# Patient Record
Sex: Male | Born: 1975 | Race: Black or African American | Hispanic: No | Smoking: Former smoker
Health system: Southern US, Community
[De-identification: ages and names within clinical notes are randomized; demographics above are authoritative.]

## PROBLEM LIST (undated history)

## (undated) DIAGNOSIS — M722 Plantar fascial fibromatosis: Secondary | ICD-10-CM

## (undated) DIAGNOSIS — E785 Hyperlipidemia, unspecified: Secondary | ICD-10-CM

## (undated) DIAGNOSIS — Z8619 Personal history of other infectious and parasitic diseases: Secondary | ICD-10-CM

## (undated) DIAGNOSIS — R7611 Nonspecific reaction to tuberculin skin test without active tuberculosis: Secondary | ICD-10-CM

## (undated) DIAGNOSIS — I10 Essential (primary) hypertension: Secondary | ICD-10-CM

## (undated) DIAGNOSIS — I517 Cardiomegaly: Secondary | ICD-10-CM

## (undated) HISTORY — DX: Personal history of other infectious and parasitic diseases: Z86.19

## (undated) HISTORY — PX: BACK SURGERY: SHX140

## (undated) HISTORY — DX: Nonspecific reaction to tuberculin skin test without active tuberculosis: R76.11

## (undated) HISTORY — DX: Essential (primary) hypertension: I10

## (undated) HISTORY — DX: Plantar fascial fibromatosis: M72.2

## (undated) HISTORY — PX: LEG SURGERY: SHX1003

## (undated) HISTORY — PX: HIP SURGERY: SHX245

## (undated) HISTORY — DX: Cardiomegaly: I51.7

## (undated) HISTORY — DX: Hyperlipidemia, unspecified: E78.5

---

## 1998-05-09 ENCOUNTER — Emergency Department (HOSPITAL_COMMUNITY): Admission: EM | Admit: 1998-05-09 | Discharge: 1998-05-09 | Payer: Self-pay | Admitting: Emergency Medicine

## 2001-07-02 ENCOUNTER — Emergency Department (HOSPITAL_COMMUNITY): Admission: EM | Admit: 2001-07-02 | Discharge: 2001-07-02 | Payer: Self-pay

## 2005-10-09 HISTORY — PX: COLPOSCOPY: SHX161

## 2007-04-16 ENCOUNTER — Encounter: Admission: RE | Admit: 2007-04-16 | Discharge: 2007-04-16 | Payer: Self-pay | Admitting: Orthopedic Surgery

## 2007-05-09 ENCOUNTER — Ambulatory Visit: Admission: RE | Admit: 2007-05-09 | Discharge: 2007-05-09 | Payer: Self-pay | Admitting: Orthopedic Surgery

## 2008-10-09 HISTORY — PX: CHOLECYSTECTOMY, LAPAROSCOPIC: SHX56

## 2011-07-24 LAB — COMPREHENSIVE METABOLIC PANEL
ALT: 20
Alkaline Phosphatase: 145 — ABNORMAL HIGH
Glucose, Bld: 85
Potassium: 3.7
Sodium: 139
Total Protein: 7.6

## 2011-07-24 LAB — CBC
Hemoglobin: 14.2
RBC: 5.26
RDW: 15 — ABNORMAL HIGH
WBC: 9.3

## 2011-07-24 LAB — CROSSMATCH
ABO/RH(D): B POS
Antibody Screen: NEGATIVE

## 2011-07-24 LAB — ABO/RH: ABO/RH(D): B POS

## 2011-07-24 LAB — PROTIME-INR: Prothrombin Time: 13.4

## 2017-01-15 ENCOUNTER — Ambulatory Visit (INDEPENDENT_AMBULATORY_CARE_PROVIDER_SITE_OTHER): Payer: Medicare Other | Admitting: Nurse Practitioner

## 2017-01-15 ENCOUNTER — Encounter: Payer: Self-pay | Admitting: Nurse Practitioner

## 2017-01-15 DIAGNOSIS — G8921 Chronic pain due to trauma: Secondary | ICD-10-CM

## 2017-01-15 DIAGNOSIS — I10 Essential (primary) hypertension: Secondary | ICD-10-CM | POA: Insufficient documentation

## 2017-01-15 DIAGNOSIS — G8929 Other chronic pain: Secondary | ICD-10-CM | POA: Insufficient documentation

## 2017-01-15 MED ORDER — METHOCARBAMOL 750 MG PO TABS: 750.0000 mg | ORAL_TABLET | Freq: Three times a day (TID) | ORAL | 1 refills | Status: DC | PRN

## 2017-01-15 MED ORDER — MELOXICAM 15 MG PO TABS: 15.0000 mg | ORAL_TABLET | Freq: Every day | ORAL | 1 refills | Status: DC

## 2017-01-15 MED ORDER — LISINOPRIL-HYDROCHLOROTHIAZIDE 20-25 MG PO TABS: 1.0000 | ORAL_TABLET | Freq: Every day | ORAL | 1 refills | Status: DC

## 2017-01-15 MED ORDER — METOPROLOL SUCCINATE ER 50 MG PO TB24: 50.0000 mg | ORAL_TABLET | ORAL | 1 refills | Status: DC

## 2017-01-15 NOTE — Progress Notes (Signed)
Pre visit review using our clinic review tool, if applicable. No additional management support is needed unless otherwise documented below in the visit note. 

## 2017-01-15 NOTE — Patient Instructions (Addendum)
Please sign medical release to get record from previous pcp [facility]).  You will be called to schedule appt with pain clinic. I do not manage chronic pain, hence will not be prescribing oxycodone.  Will need to review records from previous pcp prior to prescribing ambien.
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
## 2017-07-16 ENCOUNTER — Other Ambulatory Visit: Payer: Self-pay | Admitting: Nurse Practitioner

## 2017-07-16 DIAGNOSIS — G8921 Chronic pain due to trauma: Secondary | ICD-10-CM

## 2017-07-16 NOTE — Telephone Encounter (Signed)
Please advise, lats ov was 01/2017, suppose to come back in June, no future appt set up yet.

## 2017-08-09 ENCOUNTER — Other Ambulatory Visit: Payer: Self-pay | Admitting: Nurse Practitioner

## 2017-08-09 DIAGNOSIS — G8921 Chronic pain due to trauma: Secondary | ICD-10-CM

## 2017-08-09 HISTORY — PX: FOOT SURGERY: SHX648

## 2017-09-19 ENCOUNTER — Other Ambulatory Visit: Payer: Self-pay | Admitting: Orthopedic Surgery

## 2017-09-19 DIAGNOSIS — M25532 Pain in left wrist: Secondary | ICD-10-CM

## 2017-10-05 ENCOUNTER — Inpatient Hospital Stay
Admission: RE | Admit: 2017-10-05 | Discharge: 2017-10-05 | Disposition: A | Discharge status: Home / Self Care | Payer: Medicare Other | Source: Ambulatory Visit | Attending: Orthopedic Surgery | Admitting: Orthopedic Surgery

## 2017-10-05 ENCOUNTER — Inpatient Hospital Stay: Admission: RE | Admit: 2017-10-05 | Payer: Medicare Other | Source: Ambulatory Visit

## 2018-04-12 DIAGNOSIS — S62009A Unspecified fracture of navicular [scaphoid] bone of unspecified wrist, initial encounter for closed fracture: Secondary | ICD-10-CM | POA: Insufficient documentation

## 2018-10-07 DIAGNOSIS — H5213 Myopia, bilateral: Secondary | ICD-10-CM | POA: Insufficient documentation

## 2018-10-07 DIAGNOSIS — H268 Other specified cataract: Secondary | ICD-10-CM | POA: Insufficient documentation

## 2018-10-07 DIAGNOSIS — Z9889 Other specified postprocedural states: Secondary | ICD-10-CM | POA: Insufficient documentation

## 2018-11-25 ENCOUNTER — Ambulatory Visit (INDEPENDENT_AMBULATORY_CARE_PROVIDER_SITE_OTHER): Payer: Medicare Other | Admitting: Family Medicine

## 2018-11-25 ENCOUNTER — Encounter: Payer: Self-pay | Admitting: Family Medicine

## 2018-11-25 VITALS — BP 130/90 | HR 73 | Temp 98.4°F | Ht 76.0 in | Wt 273.4 lb

## 2018-11-25 DIAGNOSIS — M545 Low back pain, unspecified: Secondary | ICD-10-CM | POA: Insufficient documentation

## 2018-11-25 MED ORDER — KETOROLAC TROMETHAMINE 60 MG/2ML IM SOLN
60.0000 mg | Freq: Once | INTRAMUSCULAR | Status: AC
  Administered 2018-11-25: 60 mg via INTRAMUSCULAR

## 2018-11-25 MED ORDER — METHYLPREDNISOLONE ACETATE 40 MG/ML IJ SUSP
40.0000 mg | Freq: Once | INTRAMUSCULAR | Status: AC
  Administered 2018-11-25: 40 mg via INTRAMUSCULAR

## 2018-11-25 NOTE — Patient Instructions (Signed)
Nice to meet you  Please try the exercises  Please try the medicine if the pain doesn't improve.  Please try heat on the area  Please try massage  Please follow up if your pain isn't better.

## 2018-11-25 NOTE — Assessment & Plan Note (Addendum)
Pain is likely related to spasm. History of several right sided surgeries. Still is long haul trucking.  - IM Toradol and 40 mg depo medrol  -Provided Duexis sample. -Counseled on home exercise therapy and supportive care.  -If no improvement will consider physical therapy, injections, or imaging
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
## 2018-11-28 ENCOUNTER — Encounter: Payer: Self-pay | Admitting: Nurse Practitioner

## 2018-11-28 ENCOUNTER — Ambulatory Visit (INDEPENDENT_AMBULATORY_CARE_PROVIDER_SITE_OTHER): Payer: Medicare Other | Admitting: Nurse Practitioner

## 2018-11-28 VITALS — BP 124/88 | HR 71 | Temp 98.4°F | Ht 73.0 in | Wt 272.2 lb

## 2018-11-28 DIAGNOSIS — M545 Low back pain, unspecified: Secondary | ICD-10-CM

## 2018-11-28 MED ORDER — TRAMADOL HCL 50 MG PO TABS
50.0000 mg | ORAL_TABLET | Freq: Two times a day (BID) | ORAL | 0 refills | Status: DC | PRN
Start: 2018-11-28 — End: 2019-04-23

## 2018-11-28 MED ORDER — PREDNISONE 20 MG PO TABS: ORAL_TABLET | ORAL | 0 refills | Status: AC

## 2018-11-28 MED ORDER — METHOCARBAMOL 500 MG PO TABS
500.0000 mg | ORAL_TABLET | Freq: Three times a day (TID) | ORAL | 0 refills | Status: DC | PRN
Start: 2018-11-28 — End: 2019-03-31
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
## 2018-11-28 NOTE — Patient Instructions (Signed)
Do not take tramadol and robaxin and drive due to risk of sedation.  Start oral prednisone today. Use robaxin every 8hrs x 2days, the as needed.  Alternate between warm and cold compress as needed,  Avoid prolong sitting, lifting and pushing.

## 2018-12-03 ENCOUNTER — Ambulatory Visit (INDEPENDENT_AMBULATORY_CARE_PROVIDER_SITE_OTHER): Payer: Medicare Other | Admitting: Nurse Practitioner

## 2018-12-03 ENCOUNTER — Encounter: Payer: Self-pay | Admitting: Nurse Practitioner

## 2018-12-03 VITALS — BP 124/92 | HR 74 | Temp 98.4°F | Ht 73.0 in | Wt 273.0 lb

## 2018-12-03 DIAGNOSIS — B070 Plantar wart: Secondary | ICD-10-CM | POA: Diagnosis not present

## 2018-12-03 DIAGNOSIS — M545 Low back pain, unspecified: Secondary | ICD-10-CM

## 2018-12-03 DIAGNOSIS — R220 Localized swelling, mass and lump, head: Secondary | ICD-10-CM | POA: Diagnosis not present

## 2018-12-03 NOTE — Patient Instructions (Addendum)
Please make appt with oral surgeon to possibly excise mandibular cyst.  Ok to return to work with lifting restrictions: <50lbs in next 1week.  Stop tramadol. Complete oral prednisone. Use Robaxin at bedtime as needed.  Return to office for repeat wart freeze in 1-2weeks

## 2018-12-03 NOTE — Progress Notes (Signed)
Subjective:  Patient ID: Andrew Shelton, male    DOB: [birth year]  Age: 43 y.o. MRN: [id number]  CC: Follow-up (f/u on back pain--little better today/release back to work form due ASAP--spot on right hand and knot on left left cheek. )  Rash  This is a chronic problem. The current episode started more than 1 year ago. The problem is unchanged. Location: right middle finger. The rash is characterized by itchiness. He was exposed to nothing. Pertinent negatives include no joint pain, nail changes or sore throat. Treatments tried: OTC wart removal solution. The treatment provided mild relief.  Mouth Lesions   The current episode started more than 1 week ago. The onset was gradual. The problem has been gradually worsening. Nothing relieves the symptoms. Nothing aggravates the symptoms. Associated symptoms include mouth sores and rash. Pertinent negatives include no ear pain, no headaches, no sore throat, no swollen glands, no neck pain, no neck stiffness and no URI.   Back Pain: Reports significant improvement with oral prednisone, robaxin and tramadol.  Reviewed past Medical, Social and Family history today.  Outpatient Medications Prior to Visit  Medication Sig Dispense Refill  . lisinopril-hydrochlorothiazide (PRINZIDE,ZESTORETIC) 20-25 MG tablet Take 1 tablet by mouth daily. 30 tablet 1  . methocarbamol (ROBAXIN) 500 MG tablet Take 1 tablet (500 mg total) by mouth every 8 (eight) hours as needed for muscle spasms. 21 tablet 0  . metoprolol succinate (TOPROL-XL) 50 MG 24 hr tablet Take 1 tablet (50 mg total) by mouth 1 day or 1 dose. 30 tablet 1  . traMADol (ULTRAM) 50 MG tablet Take 1 tablet (50 mg total) by mouth every 12 (twelve) hours as needed. 6 tablet 0  . zolpidem (AMBIEN) 10 MG tablet Take 10 mg by mouth at bedtime as needed for sleep.    . meloxicam (MOBIC) 15 MG tablet TAKE 1 TABLET(15 MG) BY MOUTH DAILY (Patient not taking: Reported on 12/03/2018) 30 tablet 0  . predniSONE  (DELTASONE) 20 MG tablet Take 2 tablets (40 mg total) by mouth daily with breakfast for 2 days, THEN 1.5 tablets (30 mg total) daily with breakfast for 2 days, THEN 1 tablet (20 mg total) daily with breakfast for 2 days, THEN 0.5 tablets (10 mg total) daily with breakfast for 2 days. (Patient not taking: Reported on 12/03/2018) 11 tablet 0   No facility-administered medications prior to visit.     ROS See HPI  Objective:  BP (!) 124/92   Pulse 74   Temp 98.4 F (36.9 C) (Oral)   Ht 6\' 1"  (1.854 m)   Wt 273 lb (123.8 kg)   SpO2 98%   BMI 36.02 kg/m   BP Readings from Last 3 Encounters:  12/03/18 (!) 124/92  11/28/18 124/88  11/25/18 130/90    Wt Readings from Last 3 Encounters:  12/03/18 273 lb (123.8 kg)  11/28/18 272 lb 3.2 oz (123.5 kg)  11/25/18 273 lb 6.4 oz (124 kg)    Physical Exam Vitals signs reviewed.  HENT:     Head:     Jaw: No trismus, tenderness, swelling or pain on movement.     Salivary Glands: Right salivary gland is not diffusely enlarged or tender. Left salivary gland is diffusely enlarged. Left salivary gland is not tender.      Right Ear: External ear normal.     Left Ear: External ear normal.     Nose: Nose normal.     Mouth/Throat:     Mouth: Mucous membranes are  moist.     Dentition: Normal dentition. Does not have dentures. No dental tenderness, gingival swelling, dental caries, dental abscesses or gum lesions.     Tongue: No lesions.     Palate: No mass and lesions.     Pharynx: Oropharynx is clear. Uvula midline.     Tonsils: Swelling: 0 on the right. 0 on the left.  Musculoskeletal:        General: No swelling or tenderness.     Right lower leg: No edema.     Left lower leg: No edema.  Skin:    Findings: Rash present. No erythema.     Comments: Wart on lateral aspect of right index finger, no erythema, no induration  Neurological:     Mental Status: He is alert and oriented to person, place, and time.  Psychiatric:        Mood and  Affect: Mood normal.        Behavior: Behavior normal.     Lab Results  Component Value Date   WBC 9.3 05/06/2007   HGB 14.2 05/06/2007   HCT 42.7 05/06/2007   PLT 272 05/06/2007   GLUCOSE 85 05/06/2007   ALT 20 05/06/2007   AST 16 05/06/2007   NA 139 05/06/2007   K 3.7 05/06/2007   CL 103 05/06/2007   CREATININE 0.78 05/06/2007   BUN 7 05/06/2007   CO2 28 05/06/2007   INR 1.0 05/06/2007   Cryotherapy: Location: right index finger With use of Histofreeze canister and attached disposable applicator, applied solution to wart till lesion turned white. Repeated process again. Covered lesion with bandaid No complication noted.  Assessment & Plan:   Andrew Shelton was seen today for follow-up.  Diagnoses and all orders for this visit:  Acute right-sided low back pain without sciatica  Mandibular mass -     Ambulatory referral to Oral Maxillofacial Surgery  Plantar wart   I am having Andrew Shelton maintain his zolpidem, lisinopril-hydrochlorothiazide, metoprolol succinate, meloxicam, predniSONE, methocarbamol, and traMADol.  No orders of the defined types were placed in this encounter.   Problem List Items Addressed This Visit      Other   Acute right-sided low back pain without sciatica - Primary    Other Visit Diagnoses    Mandibular mass       Relevant Orders   Ambulatory referral to Oral Maxillofacial Surgery   Plantar wart           Follow-up: Return if symptoms worsen or fail to improve.  Andrew Penna, NP

## 2019-03-31 ENCOUNTER — Encounter: Payer: Self-pay | Admitting: Nurse Practitioner

## 2019-03-31 ENCOUNTER — Other Ambulatory Visit: Payer: Self-pay

## 2019-03-31 ENCOUNTER — Ambulatory Visit (INDEPENDENT_AMBULATORY_CARE_PROVIDER_SITE_OTHER): Payer: Medicare Other | Admitting: Nurse Practitioner

## 2019-03-31 ENCOUNTER — Ambulatory Visit (INDEPENDENT_AMBULATORY_CARE_PROVIDER_SITE_OTHER): Payer: Medicare Other

## 2019-03-31 VITALS — Ht 73.0 in

## 2019-03-31 DIAGNOSIS — M5441 Lumbago with sciatica, right side: Secondary | ICD-10-CM | POA: Diagnosis not present

## 2019-03-31 DIAGNOSIS — G8929 Other chronic pain: Secondary | ICD-10-CM

## 2019-03-31 MED ORDER — METHOCARBAMOL 500 MG PO TABS: 500.0000 mg | ORAL_TABLET | Freq: Three times a day (TID) | ORAL | 0 refills | Status: DC | PRN

## 2019-03-31 MED ORDER — PREDNISONE 10 MG (21) PO TBPK: ORAL_TABLET | ORAL | 0 refills | Status: DC

## 2019-03-31 NOTE — Progress Notes (Signed)
Virtual Visit via Video Note  I connected with Andrew Shelton on 04/01/19 at  1:00 PM EDT by a video enabled telemedicine application and verified that I am speaking with the correct person using two identifiers.  Location: Patient: Home Provider: Office   I discussed the limitations of evaluation and management by telemedicine and the availability of in person appointments. The patient expressed understanding and agreed to proceed.  CC:pt is c/o lower back pain/4 days/took ibuprofen,biofreez, montrin/ no vital sign to share.  History of Present Illness: Back Pain This is a chronic problem. The problem occurs intermittently. The problem has been waxing and waning since onset. The pain is present in the lumbar spine. The quality of the pain is described as cramping, aching and shooting. The pain radiates to the right thigh and right knee. The pain is at a severity of 10/10. The pain is severe. The pain is the same all the time. The symptoms are aggravated by bending, sitting, standing and twisting. Stiffness is present all day. Pertinent negatives include no bladder incontinence, bowel incontinence, dysuria, fever, leg pain, numbness, paresis, paresthesias, pelvic pain, perianal numbness, tingling, weakness or weight loss. Risk factors include sedentary lifestyle and poor posture (s/p right hip arthrosplasty). He has tried NSAIDs, analgesics, muscle relaxant and ice for the symptoms. The treatment provided mild relief.   Observations/Objective: Physical Exam  Constitutional: He is oriented to person, place, and time. No distress.  Pulmonary/Chest: Effort normal.  Neurological: He is alert and oriented to person, place, and time.  Psychiatric: He has a normal mood and affect. His behavior is normal.   Assessment and Plan: Andrew Shelton was seen today for back pain.  Diagnoses and all orders for this visit:  Chronic right-sided low back pain with right-sided sciatica -     DG Lumbar Spine  Complete -     predniSONE (STERAPRED UNI-PAK 21 TAB) 10 MG (21) TBPK tablet; As directed on package -     methocarbamol (ROBAXIN) 500 MG tablet; Take 1 tablet (500 mg total) by mouth every 8 (eight) hours as needed for muscle spasms. -     Ambulatory referral to Physical Therapy   Follow Up Instructions: X-ray indicates scoliosis and degenerative disc disease. Preserved disc space. Sent oral prdnisone, muscles relaxant and entered referral for PT. Work not written for next 3days.  I discussed the assessment and treatment plan with the patient. The patient was provided an opportunity to ask questions and all were answered. The patient agreed with the plan and demonstrated an understanding of the instructions.   The patient was advised to call back or seek an in-person evaluation if the symptoms worsen or if the condition fails to improve as anticipated.   Wilfred Lacy, NP

## 2019-04-01 ENCOUNTER — Telehealth: Payer: Self-pay

## 2019-04-01 ENCOUNTER — Encounter: Payer: Self-pay | Admitting: Nurse Practitioner

## 2019-04-01 NOTE — Telephone Encounter (Signed)
Andrew Shelton please advise 

## 2019-04-01 NOTE — Telephone Encounter (Signed)
I will leave work as is for now. Please advise him to call office if no improvement during the time period indicated

## 2019-04-01 NOTE — Telephone Encounter (Signed)
Copied from [place] [id number]. Topic: General - Inquiry >> Apr 01, 2019 12:28 PM Selinda Flavin B, NT wrote: Reason for CRM: Patient calling and states that he just got off the phone with his job and he is wanting to know if his work note that was written yesterday (03/31/2019) could be rewritten and extended until Saturday (04/05/2019) or Sunday (04/06/2019)? Please advise.  CB#: [phone]

## 2019-04-01 NOTE — Patient Instructions (Signed)
X-ray indicates scoliosis and degenerative disc disease. Preserved disc space. Sent oral prdnisone, muscles relaxant and entered referral for PT. Work not written for next 3days.

## 2019-04-02 ENCOUNTER — Encounter: Payer: Self-pay | Admitting: Nurse Practitioner

## 2019-04-02 NOTE — Telephone Encounter (Signed)
Letter written, pt is aware to print it out from my chart.

## 2019-04-02 NOTE — Telephone Encounter (Signed)
Ok to extend note to Friday. He is to return to work on Saturday if he works on weekend

## 2019-04-02 NOTE — Telephone Encounter (Signed)
Pt is aware but he report not feeling any better after took prednisone and muscle relaxer (started yesterday). Please advise.

## 2019-04-03 ENCOUNTER — Encounter: Payer: Self-pay | Admitting: Nurse Practitioner

## 2019-04-07 ENCOUNTER — Encounter: Payer: Self-pay | Admitting: Nurse Practitioner

## 2019-04-10 ENCOUNTER — Telehealth: Payer: Self-pay | Admitting: Nurse Practitioner

## 2019-04-10 ENCOUNTER — Ambulatory Visit: Payer: Medicare Other | Attending: Nurse Practitioner | Admitting: Physical Therapy

## 2019-04-10 ENCOUNTER — Other Ambulatory Visit: Payer: Self-pay

## 2019-04-10 ENCOUNTER — Encounter: Payer: Self-pay | Admitting: Physical Therapy

## 2019-04-10 DIAGNOSIS — M62830 Muscle spasm of back: Secondary | ICD-10-CM | POA: Diagnosis present

## 2019-04-10 DIAGNOSIS — M545 Low back pain: Secondary | ICD-10-CM | POA: Insufficient documentation

## 2019-04-10 DIAGNOSIS — G8929 Other chronic pain: Secondary | ICD-10-CM | POA: Diagnosis present

## 2019-04-10 NOTE — Telephone Encounter (Signed)
Please inform patient his letter for work is available through [employer]
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
## 2019-04-14 NOTE — Telephone Encounter (Signed)
Letter faxed as pt requested, sent message on my chart inform the pt.

## 2019-04-18 ENCOUNTER — Encounter

## 2019-04-21 ENCOUNTER — Ambulatory Visit: Payer: Medicare Other | Admitting: Physical Therapy

## 2019-04-23 ENCOUNTER — Other Ambulatory Visit: Payer: Self-pay | Admitting: Nurse Practitioner

## 2019-04-23 ENCOUNTER — Ambulatory Visit: Payer: Medicare Other | Admitting: Physical Therapy

## 2019-04-23 DIAGNOSIS — G8929 Other chronic pain: Secondary | ICD-10-CM

## 2019-04-23 DIAGNOSIS — M5441 Lumbago with sciatica, right side: Secondary | ICD-10-CM

## 2019-04-23 DIAGNOSIS — G8921 Chronic pain due to trauma: Secondary | ICD-10-CM

## 2019-04-23 MED ORDER — NABUMETONE 500 MG PO TABS
500.0000 mg | ORAL_TABLET | Freq: Two times a day (BID) | ORAL | 0 refills | Status: DC | PRN
Start: 2019-04-23 — End: 2019-07-09

## 2019-04-23 NOTE — Telephone Encounter (Signed)
Pt stated he still has in pain even when he see PT, pt stated he saw PT 1 time and he has an appt Next week with them--they mention something about injections. Pt was wondering if Baldo Ash will send another rx because he doesn't have anything to help with the pain at all. (he suppose to see PT twice a week for 3 wks).

## 2019-04-23 NOTE — Telephone Encounter (Signed)
Robaxin and relafen sen to help with back pain while undergoing PT. Do not take ASA or ibuprofen or naproxen while taking using relafen. Take relafen with food. Do not take robaxin and operate heavy machinery

## 2019-04-24 ENCOUNTER — Telehealth: Payer: Self-pay | Admitting: Physical Therapy

## 2019-04-24 NOTE — Telephone Encounter (Signed)
Pt is aware.  

## 2019-04-24 NOTE — Telephone Encounter (Signed)
Spoke with patient regarding no show yesterday. Reports he must have misunderstood when they were setting up the appointments and will be at his next one.  Micheil Klaus C. Briellah Baik PT, DPT 04/24/19 4:07 PM

## 2019-04-29 ENCOUNTER — Ambulatory Visit: Payer: Medicare Other | Admitting: Physical Therapy

## 2019-04-29 ENCOUNTER — Telehealth: Payer: Self-pay | Admitting: Physical Therapy

## 2019-04-29 NOTE — Telephone Encounter (Signed)
LVM regarding missed appointment today. Noted that since it is his second missed appointment we are required per clinic policy to schedule only one visit at a time. Discussed when his next appointment date and time is and if he cannot make that appointment it is important for him to call and we can cancel or reschedule that appointment. If he misses his 3rd appointment it is grounds for discharge per the clinic policy.

## 2019-04-30 ENCOUNTER — Ambulatory Visit: Payer: Medicare Other | Admitting: Physical Therapy

## 2019-05-01 ENCOUNTER — Other Ambulatory Visit: Payer: Self-pay

## 2019-05-01 ENCOUNTER — Encounter: Payer: Self-pay | Admitting: Physical Therapy

## 2019-05-01 ENCOUNTER — Ambulatory Visit: Payer: Medicare Other | Admitting: Physical Therapy

## 2019-05-01 DIAGNOSIS — M545 Low back pain: Secondary | ICD-10-CM | POA: Diagnosis not present

## 2019-05-01 DIAGNOSIS — G8929 Other chronic pain: Secondary | ICD-10-CM

## 2019-05-01 DIAGNOSIS — M62830 Muscle spasm of back: Secondary | ICD-10-CM

## 2019-05-01 NOTE — Therapy (Addendum)
[place], [place], [address] Phone: [phone]   Fax:  [phone]  Physical Therapy Treatment / discharge  Patient Details  Name: Andrew Shelton MRN: [id number] Date of Birth: 08/23/1976 Referring Provider (PT): Wilfred Lacy    Encounter Date: 05/01/2019  PT End of Session - 05/01/19 1600    Visit Number  2    Number of Visits  12    Date for PT Re-Evaluation  05/22/19    Authorization Type  medicare Kx on 15th visit progress note on 10th    PT Start Time  1600   pt arrived 15 min late   PT Stop Time  1626    PT Time Calculation (min)  26 min    Activity Tolerance  Patient tolerated treatment well    Behavior During Therapy  [facility] for tasks assessed/performed       Past Medical History:  Diagnosis Date  . Enlarged heart   . History of chicken pox   . History of positive PPD, treatment status unknown   . Hyperlipidemia   . Hypertension   . Plantar fasciitis     Past Surgical History:  Procedure Laterality Date  . BACK SURGERY    . CHOLECYSTECTOMY, LAPAROSCOPIC  10/09/2008  . COLPOSCOPY  10/09/2005  . FOOT SURGERY  08/2017  . HIP SURGERY    . LEG SURGERY      There were no vitals filed for this visit.  Subjective Assessment - 05/01/19 1602    Subjective  "The back is still bothering    Currently in Pain?  Yes    Pain Score  5     Pain Location  Back    Pain Orientation  Right    Pain Descriptors / Indicators  Aching    Pain Type  Chronic pain    Pain Onset  More than a month ago    Pain Frequency  Constant    Aggravating Factors   varies, standing, bending forward, prolonged sitting.         [facility] PT Assessment - 05/01/19 0001      Assessment   Medical Diagnosis  Low back pain     Referring Provider (PT)  Wilfred Lacy                    [facility] Adult PT Treatment/Exercise - 05/01/19 0001      Lumbar Exercises: Stretches   Active Hamstring Stretch  3 reps;30  seconds    Lower Trunk Rotation Limitations  2 x 10      Lumbar Exercises: Supine   Bent Knee Raise  10 reps   holding 1 sec, while keeping core tight   Straight Leg Raise  15 reps      Manual Therapy   Manual Therapy  Muscle Energy Technique;Joint mobilization    Joint Mobilization  LAD grad V     Muscle Energy Technique  Resisted R hip flexion             PT Education - 05/01/19 1623    Education Details  reviewed previously provided HEP, updated for potential L SIJ involvement    Person(s) Educated  Patient    Methods  Explanation;Verbal cues;Handout    Comprehension  Verbalized understanding;Verbal cues required       PT Short Term Goals - 04/10/19 1413      PT SHORT TERM GOAL #1   Title  Patient will increase passive right hip  flexion to 80 degrees without pain    Time  3    Period  Weeks    Status  New    Target Date  05/01/19      PT SHORT TERM GOAL #2   Title  Patient will increase right hip flexion and abduction strength to 4+/5    Time  3    Period  Weeks    Status  New    Target Date  05/01/19      PT SHORT TERM GOAL #3   Title  Patient will increase lumbar flexion by 20 degrees without pain    Time  3    Period  Weeks    Status  New    Target Date  05/01/19        PT Long Term Goals - 04/10/19 1416      PT LONG TERM GOAL #1   Title  Patient will sit for 3 hours without increased pain in order to perfrom work tasks    Time  6    Period  Weeks    Status  New    Target Date  05/01/19      PT LONG TERM GOAL #2   Title  Patient will be independent with HEP that promotes self management of symptoms and core stability    Time  6    Period  Weeks    Status  New    Target Date  05/22/19      PT LONG TERM GOAL #3   Title  Patient will demonstrate 90 degrees of hip flexion without pain in order to perfrom ADL's    Time  6    Period  Weeks    Status  New    Target Date  05/22/19            Plan - 05/01/19 1624    Clinical  Impression Statement  pt arrived 15 min late returning for his 2nd visit after 3 weeks due to reporting confusing his appointments. due to limited time focused on the R low back to address potential SIJ involvement utilzing hamstring stretching followed with R hip flexor MET, and core strengthening. end of session he noted decreased pain / stiffnes in the low back.    PT Treatment/Interventions  ADLs/Self Care Home Management;Cryotherapy;Electrical Stimulation;Iontophoresis 20m/ml Dexamethasone;Moist Heat;Ultrasound;DME Instruction;Gait training;Stair training;Functional mobility training;Therapeutic activities;Therapeutic exercise;Neuromuscular re-education;Patient/family education;Manual techniques;Passive range of motion;Dry needling;Taping    PT Next Visit Plan  hip flexor MET, hamstring stretching, LAD and posterior hip mobilization to improve hip flexion, soft tissue mobilization to lumbar spine and upper glutals, consider TPDN, Consider supine clamshell, ball squeeze with breathing, piriformis stretch; seated hamstring stretch    PT Home Exercise Plan  PPT, trigger point release to quadratus and upper glutes,, hamstring stretching (seated and supine), SLR, self MET, lower trunk rotation       Patient will benefit from skilled therapeutic intervention in order to improve the following deficits and impairments:  Abnormal gait, Pain, Decreased activity tolerance, Decreased mobility, Difficulty walking, Increased muscle spasms, Increased fascial restricitons  Visit Diagnosis: 1. Chronic bilateral low back pain without sciatica   2. Muscle spasm of back        Problem List Patient Active Problem List   Diagnosis Date Noted  . Acute right-sided low back pain without sciatica 11/25/2018  . Chronic pain 01/15/2017  . HTN (hypertension) 01/15/2017   KStarr LakePT, DPT, LAT, ATC  05/01/19  4:29 PM  [place], [place], [address] Phone: [phone]   Fax:  [phone]  Name: Andrew Shelton MRN: [id number] Date of Birth: 11/07/1975       PHYSICAL THERAPY DISCHARGE SUMMARY  Visits from Start of Care: 2  Current functional level related to goals / functional outcomes: See goals   Remaining deficits: unknown   Education / Equipment: HEP  Plan: Patient agrees to discharge.  Patient goals were not met. Patient is being discharged due to not returning since the last visit.  ?????         Kenedi Cilia PT, DPT, LAT, ATC  06/03/19  1:04 PM

## 2019-05-02 ENCOUNTER — Encounter: Payer: Medicare Other | Admitting: Physical Therapy

## 2019-05-06 ENCOUNTER — Ambulatory Visit: Payer: Medicare Other | Admitting: Physical Therapy

## 2019-05-08 ENCOUNTER — Ambulatory Visit: Payer: Medicare Other | Admitting: Physical Therapy

## 2019-05-11 ENCOUNTER — Other Ambulatory Visit: Payer: Self-pay | Admitting: Nurse Practitioner

## 2019-05-11 DIAGNOSIS — G8929 Other chronic pain: Secondary | ICD-10-CM

## 2019-05-13 ENCOUNTER — Ambulatory Visit: Payer: Medicare Other | Admitting: Physical Therapy

## 2019-05-20 ENCOUNTER — Other Ambulatory Visit: Payer: Self-pay | Admitting: Family Medicine

## 2019-05-20 ENCOUNTER — Ambulatory Visit
Admission: RE | Admit: 2019-05-20 | Discharge: 2019-05-20 | Disposition: A | Discharge status: Home / Self Care | Payer: Worker's Compensation | Source: Ambulatory Visit | Attending: Family Medicine | Admitting: Family Medicine

## 2019-05-20 DIAGNOSIS — S8992XA Unspecified injury of left lower leg, initial encounter: Secondary | ICD-10-CM

## 2019-06-13 ENCOUNTER — Other Ambulatory Visit: Payer: Self-pay | Admitting: Nurse Practitioner

## 2019-06-13 DIAGNOSIS — G8929 Other chronic pain: Secondary | ICD-10-CM

## 2019-07-09 ENCOUNTER — Other Ambulatory Visit: Payer: Self-pay | Admitting: Nurse Practitioner

## 2019-07-09 ENCOUNTER — Encounter: Payer: Self-pay | Admitting: Nurse Practitioner

## 2019-07-09 ENCOUNTER — Other Ambulatory Visit: Payer: Self-pay

## 2019-07-09 ENCOUNTER — Ambulatory Visit (INDEPENDENT_AMBULATORY_CARE_PROVIDER_SITE_OTHER): Payer: Medicare Other | Admitting: Nurse Practitioner

## 2019-07-09 VITALS — BP 122/80 | HR 74 | Temp 98.3°F | Ht 73.0 in | Wt 283.4 lb

## 2019-07-09 DIAGNOSIS — M545 Low back pain, unspecified: Secondary | ICD-10-CM

## 2019-07-09 DIAGNOSIS — G8929 Other chronic pain: Secondary | ICD-10-CM

## 2019-07-09 MED ORDER — MELOXICAM 15 MG PO TABS: 15.0000 mg | ORAL_TABLET | Freq: Every day | ORAL | 0 refills | Status: DC

## 2019-07-09 MED ORDER — KETOROLAC TROMETHAMINE 30 MG/ML IJ SOLN
30.0000 mg | Freq: Once | INTRAMUSCULAR | Status: AC
  Administered 2019-07-09: 16:00:00 30 mg via INTRAMUSCULAR

## 2019-07-09 NOTE — Patient Instructions (Addendum)
Alternate between warm and cold compress Consider changing you mattress.  Use meloxicam and robaxin as prescribed Looks like you filled tramadol Rx 06/27/2019. Continue use of medication for severe pain only.
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
## 2019-07-10 ENCOUNTER — Telehealth: Payer: Self-pay | Admitting: Nurse Practitioner

## 2019-07-10 ENCOUNTER — Encounter: Payer: Self-pay | Admitting: Nurse Practitioner

## 2019-07-10 DIAGNOSIS — G8929 Other chronic pain: Secondary | ICD-10-CM | POA: Insufficient documentation

## 2019-07-10 DIAGNOSIS — M545 Low back pain, unspecified: Secondary | ICD-10-CM | POA: Insufficient documentation
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
## 2019-07-10 NOTE — Telephone Encounter (Signed)
I tried to call patient - unable to leave message on vm, patient voicemail is full.

## 2019-07-10 NOTE — Telephone Encounter (Signed)
Please contact Mr. Josie to schedule CPE next week. We also need to collect fasting labs on day of appt.

## 2019-07-10 NOTE — Telephone Encounter (Signed)
Unable to leave vm--mailbox is full/ Admin team please help call the pt and mention fasting lab same day

## 2019-08-03 ENCOUNTER — Other Ambulatory Visit: Payer: Self-pay | Admitting: Nurse Practitioner

## 2019-08-03 DIAGNOSIS — M545 Low back pain: Secondary | ICD-10-CM

## 2019-08-03 DIAGNOSIS — G8929 Other chronic pain: Secondary | ICD-10-CM

## 2019-08-28 ENCOUNTER — Other Ambulatory Visit: Payer: Self-pay

## 2019-08-28 ENCOUNTER — Ambulatory Visit: Payer: Self-pay

## 2019-08-28 ENCOUNTER — Ambulatory Visit (INDEPENDENT_AMBULATORY_CARE_PROVIDER_SITE_OTHER): Payer: Medicare Other | Admitting: Specialist

## 2019-08-28 ENCOUNTER — Encounter: Payer: Self-pay | Admitting: Specialist

## 2019-08-28 VITALS — BP 139/85 | HR 85 | Ht 76.0 in | Wt 283.0 lb

## 2019-08-28 DIAGNOSIS — G8929 Other chronic pain: Secondary | ICD-10-CM

## 2019-08-28 DIAGNOSIS — M5136 Other intervertebral disc degeneration, lumbar region: Secondary | ICD-10-CM | POA: Diagnosis not present

## 2019-08-28 DIAGNOSIS — M48062 Spinal stenosis, lumbar region with neurogenic claudication: Secondary | ICD-10-CM

## 2019-08-28 DIAGNOSIS — M4156 Other secondary scoliosis, lumbar region: Secondary | ICD-10-CM

## 2019-08-28 DIAGNOSIS — M545 Low back pain: Secondary | ICD-10-CM | POA: Diagnosis not present

## 2019-08-28 MED ORDER — DICLOFENAC SODIUM 50 MG PO TBEC: 50.0000 mg | DELAYED_RELEASE_TABLET | Freq: Two times a day (BID) | ORAL | 1 refills | Status: AC

## 2019-08-28 MED ORDER — GABAPENTIN 300 MG PO CAPS: 300.0000 mg | ORAL_CAPSULE | Freq: Every day | ORAL | 3 refills | Status: DC

## 2019-08-28 NOTE — Patient Instructions (Signed)
Avoid bending, stooping and avoid lifting weights greater than 10 lbs. Avoid prolong standing and walking. Avoid frequent bending and stooping  No lifting greater than 10 lbs. May use ice or moist heat for pain. Weight loss is of benefit. Handicap license is approved. I need the MRI from [facility] off [address] to further evaluate your spine.  Avoid frequent bending and stooping  No lifting greater than 10 lbs. May use ice or moist heat for pain. Weight loss is of benefit. Best medication for lumbar disc disease is arthritis medications like motrin, celebrex and naprosyn. Exercise is important to improve your indurance and does allow people to function better inspite of back pain.
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
## 2019-09-08 ENCOUNTER — Ambulatory Visit: Payer: Self-pay

## 2019-09-08 ENCOUNTER — Telehealth: Payer: Self-pay | Admitting: Nurse Practitioner

## 2019-09-08 NOTE — Telephone Encounter (Signed)
Pt called back for message form Wilfred Lacy. Informed pt of the message of Wilfred Lacy dated 09/08/19 at 12:08 pm. Pt verbalized understanding.

## 2019-09-08 NOTE — Telephone Encounter (Signed)
Please advise to f/up with ortho for additional recommendation

## 2019-09-08 NOTE — Telephone Encounter (Signed)
Pt request refill for meloxicam 15 mg and methocarbamol 500 mg. Pt stated Dr. Louanne Skye (orthopedic) gave him diclofenac 50 mg tab and gabapentin 300 mg but these Rx didn't help much (he feels like its help at the beginning only). Charlotte please advise  Pt stated he has a f/u appt with them on 09/11/2019--Dr. Louanne Skye wants his MRI that's done last 8 mo ago fax over prior to 09/11/2019--checking with the front for this--unable to see in system. FYI

## 2019-09-08 NOTE — Telephone Encounter (Signed)
LVM for the pt to call back, need to inform the pt of Charlotte's message below. [place] for triage nurse to give detail message if able to.

## 2019-09-08 NOTE — Telephone Encounter (Signed)
Pt returned call. Inform pt of the message of Wilfred Lacy dated 09/08/19 @12 :08 pm. Pt verbalized understanding.

## 2019-09-11 ENCOUNTER — Encounter: Payer: Self-pay | Admitting: Surgery

## 2019-09-11 ENCOUNTER — Other Ambulatory Visit: Payer: Self-pay

## 2019-09-11 ENCOUNTER — Ambulatory Visit (INDEPENDENT_AMBULATORY_CARE_PROVIDER_SITE_OTHER): Payer: Medicare Other | Admitting: Surgery

## 2019-09-11 DIAGNOSIS — M48062 Spinal stenosis, lumbar region with neurogenic claudication: Secondary | ICD-10-CM

## 2019-09-11 NOTE — Progress Notes (Signed)
43 year old male returns.  Last seen by Dr. Louanne Skye and he wanted patient to bring in a copy of his lumbar MRI to review.  Patient brought in a CD with 1022 pages of previous office notes.  Did not bring in copy of his lumbar MRI report or images.  My assistant advised patient to retrieve the documents that Dr. Louanne Skye wanted and he will follow up in 4 weeks for recheck with Dr. Louanne Skye to review that study once it has been obtained.

## 2019-10-07 ENCOUNTER — Telehealth: Payer: Self-pay | Admitting: Specialist

## 2019-10-07 NOTE — Telephone Encounter (Signed)
Patient called. He was told he would hear back from [place] to be scheduled for a MRI.    Call back number: [phone]

## 2019-10-08 NOTE — Telephone Encounter (Signed)
I called this patient, he states that he has called everywhere that he has been and no one has an MRI of his back.  (Only MRI I see that has been done is on his knee) He says that at his last visit he was told by the assistant that day that our office was going to order an MRI of his back, and so he is calling back to check the status of this.  I did advise that this is not what the notes say from either provider however I would ask James about it tomorrow. Can we order and MRI of his lumbar spine? 

## 2019-10-09 NOTE — Telephone Encounter (Signed)
I called this patient, he states that he has called everywhere that he has been and no one has an MRI of his back.  (Only MRI I see that has been done is on his knee) He says that at his last visit he was told by the assistant that day that our office was going to order an MRI of his back, and so he is calling back to check the status of this.  I did advise that this is not what the notes say from either provider however I would ask Jeneen Rinks about it tomorrow. Can we order and MRI of his lumbar spine?

## 2019-10-09 NOTE — Telephone Encounter (Signed)
It is up to Dr Louanne Skye if he wants to order a new study.  Dr Otho Ket note was very clear that he had it done somewhere.  Does not need to be rescheduled until he brings study in for Dr Louanne Skye to review or Dr Louanne Skye ok's ordering a new study of his choice.

## 2019-10-15 ENCOUNTER — Encounter: Payer: Self-pay | Admitting: Specialist

## 2019-10-15 ENCOUNTER — Ambulatory Visit (INDEPENDENT_AMBULATORY_CARE_PROVIDER_SITE_OTHER): Payer: Medicare Other | Admitting: Specialist

## 2019-10-15 ENCOUNTER — Other Ambulatory Visit: Payer: Self-pay

## 2019-10-15 VITALS — BP 138/89 | HR 80 | Ht 76.0 in | Wt 283.0 lb

## 2019-10-15 DIAGNOSIS — M4807 Spinal stenosis, lumbosacral region: Secondary | ICD-10-CM | POA: Diagnosis not present

## 2019-10-15 DIAGNOSIS — M5030 Other cervical disc degeneration, unspecified cervical region: Secondary | ICD-10-CM

## 2019-10-15 DIAGNOSIS — M542 Cervicalgia: Secondary | ICD-10-CM

## 2019-10-15 DIAGNOSIS — M5136 Other intervertebral disc degeneration, lumbar region: Secondary | ICD-10-CM | POA: Diagnosis not present

## 2019-10-15 NOTE — Patient Instructions (Signed)
Avoid frequent bending and stooping  No lifting greater than 10 lbs. May use ice or moist heat for pain. Weight loss is of benefit.  Exercise is important to improve your indurance and does allow people to function better inspite of back pain. Avoid overhead lifting and overhead use of the arms. Do not lift greater than 5 lbs. Adjust head rest in vehicle to prevent hyperextension if rear ended. Take extra precautions to avoid falling, including use of a cane if you feel weak.   Hemp CBD capsules, amazon.com 5,000-7,000 mg per bottle, 60 capsules per bottle, take one capsule twice a day.  Follow-Up Instructions: No follow-ups on file.  I recommend a total myelogram and post myelogram CT scan of cervical, thoracic and lumbar areas.
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
## 2019-10-16 ENCOUNTER — Telehealth: Payer: Self-pay | Admitting: Nurse Practitioner

## 2019-10-16 NOTE — Telephone Encounter (Signed)
MRIs are all old, he is taking and requesting strong narcotics, I recommend a new study, myelogram and post myelogram CT scan total spine due to cervical and lumbar complaints of severe pain. I will not prescribe stronger than Tramadol for chronic DDD, pain management will be ordered at the same time as myelogram mainly because exam is  Basically nonfocal and I am concerned that he has a chronic pain syndrome associated with a significant traumatic event, he has been on some form of narcotic for sometime. Will obtain study due to his complaints that are chronic.

## 2019-10-16 NOTE — Telephone Encounter (Signed)
Order placed for Myelogram

## 2019-10-16 NOTE — Telephone Encounter (Signed)
Phone call to patient to verify medication list and allergies for myelogram procedure. Pt aware he will not need to hold any medications for this procedure. Pre and post procedure instructions reviewed with pt. Pt verbalized understanding.  

## 2019-10-16 NOTE — Addendum Note (Signed)
Addended by: Penne Lash, Otis Dials on: 10/16/2019 01:48 PM   Modules accepted: Orders

## 2019-10-20 ENCOUNTER — Telehealth: Payer: Self-pay | Admitting: Specialist

## 2019-10-20 ENCOUNTER — Telehealth: Payer: Self-pay | Admitting: Nurse Practitioner

## 2019-10-20 NOTE — Telephone Encounter (Signed)
Pt call stated that Dr. Otelia Sergeant wants him to see pain management. They place a referral but they also gave him the referal paper for him to find his own as well. Pt is confuse, he was wondering if Claris Gower can recommend pain management that we used.   Admin team please help check on referral that placed on 10/15/2019 to [facility]. Not sure why they told him to find his own doctor?

## 2019-10-20 NOTE — Telephone Encounter (Signed)
Called patient left voicemail message to return call to schedule an appointment with Dr Otelia Sergeant for CT Myelo review    Patient is having the Myelogram 11/03/2019

## 2019-10-20 NOTE — Telephone Encounter (Signed)
Patient is calling and wanted to speak to someone regarding his referral. CB is [phone].

## 2019-10-22 ENCOUNTER — Telehealth: Payer: Self-pay | Admitting: Specialist

## 2019-10-22 NOTE — Telephone Encounter (Signed)
Ok, sounds good.  I called and advised patient that this has been faxed to them twice. He said thank you.

## 2019-10-22 NOTE — Telephone Encounter (Signed)
Referral was faxed on Jan 8th and then again today to [facility] pain to [employer]. She will contact pt to schedule

## 2019-10-22 NOTE — Telephone Encounter (Signed)
Pt advised to call [facility]are to check on referral status to [place] [facility] since [place] placed referral.

## 2019-10-22 NOTE — Telephone Encounter (Signed)
Patient called requesting a faxed be sent to [facility] for pain Management. Patient states Dr. Otelia Sergeant gave him paperwork for pain management but [facility] will not accept. Will only except faxes for pain management. [facility] fax number is [phone]. Patient phone number is [phone].

## 2019-10-22 NOTE — Telephone Encounter (Signed)
This was ordered on 10/15/2019-- do you know if it has been sent to [facility] yet?  Order shows as incomplete at the top, but no notes have been made on it.Andrew Shelton

## 2019-10-28 ENCOUNTER — Other Ambulatory Visit: Payer: Medicare Other

## 2019-11-03 ENCOUNTER — Other Ambulatory Visit: Payer: Medicare Other

## 2019-11-03 ENCOUNTER — Inpatient Hospital Stay
Admission: RE | Admit: 2019-11-03 | Discharge: 2019-11-03 | Disposition: A | Discharge status: Home / Self Care | Payer: Medicare Other | Source: Ambulatory Visit | Attending: Specialist | Admitting: Specialist

## 2019-11-03 NOTE — Discharge Instructions (Signed)

## 2019-11-07 ENCOUNTER — Telehealth: Payer: Self-pay | Admitting: *Deleted

## 2019-11-07 ENCOUNTER — Ambulatory Visit: Payer: Medicare Other | Admitting: Specialist

## 2019-11-07 NOTE — Telephone Encounter (Signed)
I contacted Jenel Lucks with [facility] and advised her of the orders for myelogram in the workq, she will contact pt to schedule appt and if any questions will contact me.

## 2019-11-07 NOTE — Telephone Encounter (Signed)
I received a call back from [facility] on this pt and the myelograms and it looks as if the pt was scheduled on 11/03/19 and pt was a no show, pt will have to call [facility] and call to reschedule his appt, they will not call pt.

## 2019-11-07 NOTE — Telephone Encounter (Signed)
-----   Message from [facility], [place] sent at 11/07/2019  8:24 AM EST ----- Can you please check and see why this patient has not been scheduled for complete spine myelogram.  It was ordered on 10/16/2019 and shows it has been auth'd.  He is supposed to be following up on it at today's appt.

## 2019-11-10 NOTE — Telephone Encounter (Signed)
I called and lmom for patient to call [facility] to get this rescheduled. And advised that he will need to call our office and schedule a follow up appt

## 2020-02-06 ENCOUNTER — Other Ambulatory Visit: Payer: Self-pay | Admitting: Specialist

## 2020-02-06 NOTE — Telephone Encounter (Signed)
Please advise 

## 2020-08-17 ENCOUNTER — Other Ambulatory Visit (HOSPITAL_COMMUNITY): Payer: Self-pay | Admitting: Specialist

## 2020-08-17 ENCOUNTER — Ambulatory Visit (HOSPITAL_COMMUNITY)
Admission: RE | Admit: 2020-08-17 | Discharge: 2020-08-17 | Disposition: A | Discharge status: Home / Self Care | Payer: Worker's Compensation | Source: Ambulatory Visit | Attending: Cardiology | Admitting: Cardiology

## 2020-08-17 ENCOUNTER — Other Ambulatory Visit: Payer: Self-pay

## 2020-08-17 DIAGNOSIS — M79662 Pain in left lower leg: Secondary | ICD-10-CM

## 2020-08-17 DIAGNOSIS — M7989 Other specified soft tissue disorders: Secondary | ICD-10-CM | POA: Diagnosis present
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
## 2023-12-13 ENCOUNTER — Emergency Department (HOSPITAL_COMMUNITY)
Admission: EM | Admit: 2023-12-13 | Discharge: 2023-12-13 | Disposition: A | Discharge status: Home / Self Care | Attending: Emergency Medicine | Admitting: Emergency Medicine

## 2023-12-13 ENCOUNTER — Encounter (HOSPITAL_COMMUNITY): Payer: Self-pay | Admitting: Emergency Medicine

## 2023-12-13 ENCOUNTER — Other Ambulatory Visit: Payer: Self-pay

## 2023-12-13 ENCOUNTER — Emergency Department (HOSPITAL_COMMUNITY)

## 2023-12-13 DIAGNOSIS — R030 Elevated blood-pressure reading, without diagnosis of hypertension: Secondary | ICD-10-CM

## 2023-12-13 DIAGNOSIS — R0789 Other chest pain: Secondary | ICD-10-CM | POA: Diagnosis present

## 2023-12-13 DIAGNOSIS — I10 Essential (primary) hypertension: Secondary | ICD-10-CM | POA: Insufficient documentation

## 2023-12-13 DIAGNOSIS — R072 Precordial pain: Secondary | ICD-10-CM

## 2023-12-13 LAB — TROPONIN I (HIGH SENSITIVITY)
Troponin I (High Sensitivity): 6 ng/L (ref <18)
Troponin I (High Sensitivity): 6 ng/L (ref <18)

## 2023-12-13 LAB — BASIC METABOLIC PANEL
Anion gap: 8 (ref 5–15)
BUN: 19 mg/dL (ref 6–20)
CO2: 26 mmol/L (ref 22–32)
Calcium: 9.3 mg/dL (ref 8.9–10.3)
Chloride: 104 mmol/L (ref 98–111)
Creatinine, Ser: 0.7 mg/dL (ref 0.61–1.24)
GFR, Estimated: >60 mL/min (ref >60)
Glucose, Bld: 97 mg/dL (ref 70–99)
Potassium: 3.7 mmol/L (ref 3.5–5.1)
Sodium: 138 mmol/L (ref 135–145)

## 2023-12-13 LAB — CBC
HCT: 41.8 % (ref 39.0–52.0)
Hemoglobin: 13.4 g/dL (ref 13.0–17.0)
MCH: 27.2 pg (ref 26.0–34.0)
MCHC: 32.1 g/dL (ref 30.0–36.0)
MCV: 84.8 fL (ref 80.0–100.0)
Platelets: 256 10*3/uL (ref 150–400)
RBC: 4.93 MIL/uL (ref 4.22–5.81)
RDW: 14.3 % (ref 11.5–15.5)
WBC: 5.8 10*3/uL (ref 4.0–10.5)
nRBC: 0 % (ref 0.0–0.2)

## 2023-12-13 MED ORDER — TRAMADOL HCL 50 MG PO TABS
50.0000 mg | ORAL_TABLET | Freq: Once | ORAL | Status: AC
  Administered 2023-12-13: 50 mg via ORAL
  Filled 2023-12-13: qty 1

## 2023-12-13 MED ORDER — FAMOTIDINE 20 MG PO TABS
20.0000 mg | ORAL_TABLET | Freq: Once | ORAL | Status: AC
  Administered 2023-12-13: 20 mg via ORAL
  Filled 2023-12-13: qty 1

## 2023-12-13 MED ORDER — METHOCARBAMOL 750 MG PO TABS: 750.0000 mg | ORAL_TABLET | Freq: Three times a day (TID) | ORAL | 0 refills | Status: AC | PRN

## 2023-12-13 MED ORDER — LISINOPRIL-HYDROCHLOROTHIAZIDE 20-25 MG PO TABS: 1.0000 | ORAL_TABLET | Freq: Every day | ORAL | 0 refills | Status: AC

## 2023-12-13 MED ORDER — ALUM & MAG HYDROXIDE-SIMETH 200-200-20 MG/5ML PO SUSP
30.0000 mL | Freq: Once | ORAL | Status: AC
  Administered 2023-12-13: 30 mL via ORAL
  Filled 2023-12-13: qty 30

## 2023-12-13 MED ORDER — ACETAMINOPHEN 500 MG PO TABS
1000.0000 mg | ORAL_TABLET | Freq: Once | ORAL | Status: DC
  Filled 2023-12-13: qty 2

## 2023-12-13 NOTE — ED Triage Notes (Signed)
 Patient c/o chest pain stated this afternoon. Patient report worsening mid sternal chest pain tonight while working. Patient report he got lightheaded twice. Patient denies N/V. Patient denies SOB.

## 2023-12-13 NOTE — Discharge Instructions (Addendum)
 It was our pleasure to provide your ER care today - we hope that you feel better.  If GI/reflux symptoms, try taking pepcid and maalox as need for symptom relief.   Take acetaminophen or ibuprofen as need. You may also take robaxin as need for muscle pain/spasm - no driving when taking.   For recent chest pain, follow up closely with cardiologist in the next 1-2 weeks - we made referral, and they should be contacting you with an appointment in the next few days.   Your blood pressure is high - take blood pressure med as prescribed, and follow up with primary care doctor in 1-2 weeks.   Return to ER right away if worse, new symptoms, fevers, recurrent/persistent chest pain, increased trouble breathing, or other concern.   You were given pain meds in the ER - no driving for the next 6 hours.

## 2023-12-13 NOTE — ED Provider Notes (Addendum)
 [place] EMERGENCY DEPARTMENT AT [facility] Provider Note   CSN: [id number] Arrival date & time: 12/13/23  1850     History  Chief Complaint  Patient presents with   Chest Pain    Andrew Shelton is a 48 y.o. male.  Pt with c/o mid chest pain at work today, at rest. Mid chest, dull, non radiating, constant, not pleuritic. No associated sob, nv or diaphoresis. Did feel lightheaded. No syncope. No hx cad or chd. No fam hx cad. Denies leg pain or swelling, no recent trauma, surgery, or immobility. No chest wall injury or strain. No heartburn. Mild frontal headache. No acute/abrupt/severe head pain. No fever or chills. No abd pain or nvd. No melena or rectal bleeding. Had eaten today. Indicates has been out of his meds/bp meds in past few months. No meds for pain, cp or h/a prior to arrival or earlier.   The history is provided by the patient and medical records.  Chest Pain Associated symptoms: no abdominal pain, no back pain, no cough, no fever, no numbness, no shortness of breath, no vomiting and no weakness        Home Medications Prior to Admission medications   Medication Sig Start Date End Date Taking? Authorizing Provider  diclofenac (VOLTAREN) 50 MG EC tablet Take 1 tablet (50 mg total) by mouth 2 (two) times daily. 08/28/19   Kerrin Champagne, MD  EPINEPHrine 0.3 mg/0.3 mL IJ SOAJ injection epinephrine 0.3 mg/0.3 mL injection, auto-injector  INJECT INTRAMUSCULARLY AS DIRECTED FOR SEVERE ALLERGIC REACTION    [provider]  gabapentin (NEURONTIN) 300 MG capsule TAKE 1 CAPSULE(300 MG) BY MOUTH AT BEDTIME 02/09/20   Kerrin Champagne, MD  meloxicam (MOBIC) 15 MG tablet TAKE 1 TABLET(15 MG) BY MOUTH DAILY WITH MEALS Patient not taking: Reported on 10/16/2019 08/03/19   Nche, Bonna Gains, NP  methocarbamol (ROBAXIN) 500 MG tablet TAKE 1 TABLET(500 MG) BY MOUTH EVERY 12 HOURS AS NEEDED FOR MUSCLE SPASMS 08/03/19   Nche, Bonna Gains, NP      Allergies     Other    Review of Systems   Review of Systems  Constitutional:  Negative for chills and fever.  HENT:  Negative for sore throat.   Eyes:  Negative for pain, redness and visual disturbance.  Respiratory:  Negative for cough and shortness of breath.   Cardiovascular:  Positive for chest pain. Negative for leg swelling.  Gastrointestinal:  Negative for abdominal pain, diarrhea and vomiting.  Genitourinary:  Negative for flank pain.  Musculoskeletal:  Negative for back pain and neck pain.  Skin:  Negative for rash.  Neurological:  Positive for light-headedness. Negative for syncope, weakness and numbness.    Physical Exam Updated Vital Signs BP (!) 159/101 (BP Location: Right Arm)   Pulse (!) 57   Temp 98.8 F (37.1 C) (Oral)   Resp 14   Ht 1.93 m (6\' 4" )   Wt 110.7 kg   SpO2 100%   BMI 29.70 kg/m  Physical Exam Vitals and nursing note reviewed.  Constitutional:      Appearance: Normal appearance. He is well-developed.  HENT:     Head: Atraumatic.     Comments: No sinus or temporal tenderness.     Nose: Nose normal.     Mouth/Throat:     Mouth: Mucous membranes are moist.     Pharynx: Oropharynx is clear.  Eyes:     General: No scleral icterus.    Conjunctiva/sclera: Conjunctivae  normal.     Pupils: Pupils are equal, round, and reactive to light.  Neck:     Vascular: No carotid bruit.     Trachea: No tracheal deviation.     Comments: Trachea midline. Thyroid not grossly enlarged or tender.  Cardiovascular:     Rate and Rhythm: Normal rate and regular rhythm.     Pulses: Normal pulses.     Heart sounds: Normal heart sounds. No murmur heard.    No friction rub. No gallop.  Pulmonary:     Effort: Pulmonary effort is normal. No accessory muscle usage or respiratory distress.     Breath sounds: Normal breath sounds.  Chest:     Chest wall: No tenderness.  Abdominal:     General: Bowel sounds are normal. There is no distension.     Palpations: Abdomen is soft.  There is no mass.     Tenderness: There is no abdominal tenderness.     Comments: No bruits.   Musculoskeletal:        General: No swelling or tenderness.     Cervical back: Normal range of motion and neck supple. No rigidity or tenderness.     Right lower leg: No edema.     Left lower leg: No edema.  Skin:    General: Skin is warm and dry.     Findings: No rash.  Neurological:     Mental Status: He is alert.     Comments: Alert, speech clear. Motor/sens grossly intact bil. Steady gait.   Psychiatric:        Mood and Affect: Mood normal.     ED Results / Procedures / Treatments   Labs (all labs ordered are listed, but only abnormal results are displayed) Results for orders placed or performed during the hospital encounter of 12/13/23  Basic metabolic panel   Collection Time: 12/13/23  7:06 PM  Result Value Ref Range   Sodium 138 135 - 145 mmol/L   Potassium 3.7 3.5 - 5.1 mmol/L   Chloride 104 98 - 111 mmol/L   CO2 26 22 - 32 mmol/L   Glucose, Bld 97 70 - 99 mg/dL   BUN 19 6 - 20 mg/dL   Creatinine, Ser 1.61 0.61 - 1.24 mg/dL   Calcium 9.3 8.9 - 09.6 mg/dL   GFR, Estimated >04 >54 mL/min   Anion gap 8 5 - 15  CBC   Collection Time: 12/13/23  7:06 PM  Result Value Ref Range   WBC 5.8 4.0 - 10.5 K/uL   RBC 4.93 4.22 - 5.81 MIL/uL   Hemoglobin 13.4 13.0 - 17.0 g/dL   HCT 09.8 11.9 - 14.7 %   MCV 84.8 80.0 - 100.0 fL   MCH 27.2 26.0 - 34.0 pg   MCHC 32.1 30.0 - 36.0 g/dL   RDW 82.9 56.2 - 13.0 %   Platelets 256 150 - 400 K/uL   nRBC 0.0 0.0 - 0.2 %  Troponin I (High Sensitivity)   Collection Time: 12/13/23  7:06 PM  Result Value Ref Range   Troponin I (High Sensitivity) 6 <18 ng/L  Troponin I (High Sensitivity)   Collection Time: 12/13/23  9:25 PM  Result Value Ref Range   Troponin I (High Sensitivity) 6 <18 ng/L   DG Chest 2 View Result Date: 12/13/2023 CLINICAL DATA:  Chest pain. EXAM: CHEST - 2 VIEW COMPARISON:  May 06, 2007 FINDINGS: The heart size and  mediastinal contours are within normal limits. Both lungs are clear. The  visualized skeletal structures are unremarkable. IMPRESSION: No active cardiopulmonary disease. Electronically Signed   By: Aram Candela M.D.   On: 12/13/2023 21:30    EKG EKG Interpretation Date/Time:  Thursday December 13 2023 18:57:37 EST Ventricular Rate:  65 PR Interval:  187 QRS Duration:  83 QT Interval:  366 QTC Calculation: 381 R Axis:   55  Text Interpretation: Sinus rhythm ST elev, probable normal early repol pattern Confirmed by Vonita Moss [id number]) on 12/13/2023 8:06:49 PM  Radiology DG Chest 2 View Result Date: 12/13/2023 CLINICAL DATA:  Chest pain. EXAM: CHEST - 2 VIEW COMPARISON:  May 06, 2007 FINDINGS: The heart size and mediastinal contours are within normal limits. Both lungs are clear. The visualized skeletal structures are unremarkable. IMPRESSION: No active cardiopulmonary disease. Electronically Signed   By: Aram Candela M.D.   On: 12/13/2023 21:30    Procedures Procedures    Medications Ordered in ED Medications  acetaminophen (TYLENOL) tablet 1,000 mg (1,000 mg Oral Patient Refused/Not Given 12/13/23 2148)  traMADol (ULTRAM) tablet 50 mg (has no administration in time range)  alum & mag hydroxide-simeth (MAALOX/MYLANTA) 200-200-20 MG/5ML suspension 30 mL (30 mLs Oral Given 12/13/23 2148)  famotidine (PEPCID) tablet 20 mg (20 mg Oral Given 12/13/23 2148)    ED Course/ Medical Decision Making/ A&P                                 Medical Decision Making Problems Addressed: Elevated blood pressure reading: acute illness or injury Essential hypertension: chronic illness or injury with exacerbation, progression, or side effects of treatment that poses a threat to life or bodily functions Precordial chest pain: acute illness or injury with systemic symptoms that poses a threat to life or bodily functions  Amount and/or Complexity of Data Reviewed External Data Reviewed:  notes. Labs: ordered. Decision-making details documented in ED Course. Radiology: ordered and independent interpretation performed. Decision-making details documented in ED Course. ECG/medicine tests: ordered and independent interpretation performed. Decision-making details documented in ED Course.  Risk OTC drugs. Prescription drug management. Decision regarding hospitalization.   Iv ns. Continuous pulse ox and cardiac monitoring. Labs ordered/sent. Imaging ordered.   Differential diagnosis includes acs, msk cp, gi cp, etc. Dispo decision including potential need for admission considered - will get labs and imaging and reassess.   Reviewed nursing notes and prior charts for additional history. External reports reviewed.   Cardiac monitor: sinus rhythm, rate 60.  Labs reviewed/interpreted by me - wbc and hgb normal. Chem normal. Trop normal.   Acetaminophen po, maalox po, pepcid po.   Pt requests additional pain med. Ultram po.   Recheck pt comfortable appearing. No distress. Rr 14, pulse ox 100%.   Xrays reviewed/interpreted by me - no pna.   Additional labs reviewed/interpreted by me - delta trop normal and not increasing - felt not c/w acs.   Pt currently appears stable for d/c. Pt indicates lisinopril/hctx 20/25 worked well for his bp in past, and tolerated well. Rx sent.   Rec close pcp and cardiology f/u - referral provided.   Return precautions provided.          Final Clinical Impression(s) / ED Diagnoses Final diagnoses:  None    Rx / DC Orders ED Discharge Orders     None           Cathren Laine, MD 12/13/23 2311

## 2024-02-01 ENCOUNTER — Encounter: Payer: Self-pay | Admitting: Physical Medicine & Rehabilitation

## 2024-03-07 ENCOUNTER — Ambulatory Visit: Admitting: Cardiology

## 2024-03-07 DIAGNOSIS — R072 Precordial pain: Secondary | ICD-10-CM | POA: Insufficient documentation

## 2024-03-07 NOTE — Progress Notes (Deleted)
  Cardiology Office Note:  .   Date:  03/07/2024  ID:  [name], DOB 03-Oct-1976, MRN [id number] PCP: Patient, No Pcp Per  [facility] Providers Cardiologist:  Fransico Ivy, MD PCP: Patient, No Pcp Per  No chief complaint on file.    Andrew Shelton is a 48 y.o. male with *** Discussed the use of AI scribe software for clinical note transcription with the patient, who gave verbal consent to proceed.  History of Present Illness       There were no vitals filed for this visit.    ROS      Studies Reviewed: .        *** Independently interpreted 12/2023: Chol ***, TG ***, HDL ***, LDL *** HbA1C ***% Hb 13.4 Cr 0.7 Trop HS 6, 6  Risk Assessment/Calculations:   {Does this patient have ATRIAL FIBRILLATION?:[phone]}    Physical Exam   VISIT DIAGNOSES: No diagnosis found.   Andrew Shelton is a 48 y.o. male with *** Assessment and Plan Assessment & Plan       {Are you ordering a CV Procedure (e.g. stress test, cath, DCCV, TEE, etc)?   Press F2        :[id number]}    No orders of the defined types were placed in this encounter.    F/u in ***  Signed, Cody Das, MD

## 2024-03-28 ENCOUNTER — Encounter: Attending: Physical Medicine & Rehabilitation | Admitting: Physical Medicine & Rehabilitation

## 2024-04-29 ENCOUNTER — Ambulatory Visit: Attending: Cardiology | Admitting: Cardiology

## 2024-05-13 NOTE — Telephone Encounter (Signed)
 Correct that was a typo. Pt is informed no pain medications will be prescribed.

## 2024-05-13 NOTE — Telephone Encounter (Addendum)
 Copied from CRM #[id number]. Topic: Referral - Existing Referral >> May 13, 2024  8:47 AM Frederik HERO wrote: Lorrie, [facility] is calling other request    Include all details related to the request(s) below:  Patient is referred to  [facility] [place]. States [facility] [place] states the referral was canceled . patient would like for another referral to be sent to [facility] - [place]. Patient also states he is out of pain medicine currently.    Patient would like know if PA doyle could prescribed pain medication until he is able to be seen by [facility]. Patient states he is experiencing a lot of pain. Please call patient back for an update      Confirm and type the Best Contact Number below:  Patient/caller contact number:          [phone]   [] Home  [x] Mobile  [] Work [] Other   [x] Okay to leave a voicemail   Medication List:  Current Outpatient Medications:  .  amoxicillin (AMOXIL) 500 mg capsule, Take 1 capsule by mouth in the morning and 1 capsule at noon and 1 capsule in the evening. Take with meals., Disp: , Rfl:  .  ashwagandha extract 120 mg cap, 1 a day, Disp: , Rfl:  .  busPIRone (BUSPAR) 5 mg tablet, Take 1 tablet (5 mg total) by mouth 2 (two) times a day., Disp: 60 tablet, Rfl: 2 .  cyclobenzaprine (FEXMID) 7.5 mg tablet, , Disp: , Rfl:  .  etodolac XL (LODINE XL) 400 mg Tb24, , Disp: , Rfl:  .  lisinopriL -hydrochlorothiazide  (PRINZIDE ) 20-25 mg per tablet, Take 1 tablet by mouth daily., Disp: 90 tablet, Rfl: 3 .  meloxicam  (MOBIC ) 15 mg tablet, Take 1 tablet by mouth daily. (Patient not taking: Reported on 01/17/2024), Disp: , Rfl:  .  oxyCODONE (ROXICODONE) 10 mg tab, TAKE 1 TAB EVERY 8 HOURS X 15 DAYS, THEN EVERY 6 HOURS X 15 DAYS, Disp: , Rfl:  .  predniSONE  (DELTASONE ) 20 mg tablet, Take 2 tablets by mouth daily., Disp: , Rfl:       Medication Request/Refills: Pharmacy Information (if applicable)   [x] Not Applicable       []  Pharmacy listed  Send Medication Request to:                                                 [] Pharmacy not listed (added to pharmacy list in Epic) Send Medication Request to:      Listed Pharmacies: [id number] [place], [place] - [address] AT [facility] - PHONE: [phone] - FAX: [phone] [facility] #[id number] [place], [place] - [address] AT [facility] OF [address] - PHONE: [phone] - FAX: [phone]

## 2024-05-13 NOTE — Telephone Encounter (Signed)
 Informed pt pain medication would be prescribed. Adv pt he had appointment in June. Pt states he called office and was told that we needed to send in new referral and referral was cancelled.  I called [facility] and they stated referral pt would need new referral

## 2024-05-14 NOTE — Progress Notes (Deleted)
   Cardiology [facility]:    Date:  05/14/2024   ID:  Andrew Shelton, DOB [birth year], MRN [id number]  PCP:  Patient, No Pcp Per  Cardiologist:  None { Click to update primary MD,subspecialty MD or APP then REFRESH:1}    Referring MD: Bernard Drivers, MD   Chief Complaint: chest pain  History of Present Illness:    Andrew Shelton is a 48 y.o. male with a history of hypertension, chronic pain syndrome, and anxiety/ depression who presents today as a new patient in the [facility] for further evaluation of chest pain.   Patient was seen in the ED in 12/2023 for chest pain that he described as a non-radiating dullness. EKG showed normal sinus rhythm with diffuse mild ST elevation consistent with early repolarization. High-sensitivity troponin was negative x2. He was treated with Tylenol , Ultram , Maalox, and Pepcid  and was felt to be stable for discharge.   Patient presents today for follow-up. ***  Chest Pain Patient was seen in the ED in 12/2023 for chest pain. EKG showed diffuse mild ST elevation consistent with early repolarization. High-sensitivity troponin negative x2.  - ***  Hypertension BP *** - Continue Lisinopril -HCTZ 20-25mg  daily.    EKGs/Labs/Other Studies Reviewed:    The following studies were reviewed:  None  EKG:  EKG ordered today. EKG personally reviewed and demonstrates ***.  Recent Labs: 12/13/2023: BUN 19; Creatinine, Ser 0.70; Hemoglobin 13.4; Platelets 256; Potassium 3.7; Sodium 138  Recent Lipid Panel No results found for: CHOL, TRIG, HDL, CHOLHDL, VLDL, LDLCALC, LDLDIRECT  Physical Exam:    Vital Signs: There were no vitals taken for this visit.    Wt Readings from Last 3 Encounters:  12/13/23 244 lb (110.7 kg)  10/15/19 283 lb (128.4 kg)  08/28/19 283 lb (128.4 kg)     General: 48 y.o. male in no acute distress. HEENT: Normocephalic and atraumatic. Sclera clear.  Neck: Supple. No carotid bruits. No JVD. Heart:  *** RRR. Distinct S1 and S2. No murmurs, gallops, or rubs.  Lungs: No increased work of breathing. Clear to ausculation bilaterally. No wheezes, rhonchi, or rales.  Abdomen: Soft, non-distended, and non-tender to palpation.  Extremities: No lower extremity edema.  Radial and distal pedal pulses 2+ and equal bilaterally. Skin: Warm and dry. Neuro: No focal deficits. Psych: Normal affect. Responds appropriately.   Assessment:    No diagnosis found.  Plan:     Disposition: Follow up in ***   Signed, Lashala Laser E Kyheem Bathgate, PA-C  05/14/2024 7:10 PM     [facility]

## 2024-05-19 ENCOUNTER — Ambulatory Visit: Attending: Student | Admitting: Student

## 2024-05-28 ENCOUNTER — Encounter: Payer: Self-pay | Admitting: Physical Medicine & Rehabilitation

## 2024-07-10 ENCOUNTER — Encounter: Admitting: Physical Medicine & Rehabilitation

## 2024-09-21 ENCOUNTER — Encounter (HOSPITAL_COMMUNITY): Payer: Self-pay

## 2024-09-21 ENCOUNTER — Other Ambulatory Visit: Payer: Self-pay

## 2024-09-21 ENCOUNTER — Emergency Department (HOSPITAL_COMMUNITY)
Admission: EM | Admit: 2024-09-21 | Discharge: 2024-09-21 | Disposition: A | Discharge status: Home / Self Care | Attending: Emergency Medicine | Admitting: Emergency Medicine

## 2024-09-21 DIAGNOSIS — M25552 Pain in left hip: Secondary | ICD-10-CM

## 2024-09-21 DIAGNOSIS — R200 Anesthesia of skin: Secondary | ICD-10-CM

## 2024-09-21 DIAGNOSIS — M79642 Pain in left hand: Secondary | ICD-10-CM

## 2024-09-21 NOTE — ED Provider Triage Note (Signed)
 Emergency Medicine Provider Triage Evaluation Note  Andrew Shelton , a 48 y.o. male  was evaluated in triage.  Pt complains of left arm pain, numbness and weakness.  Patient also states he has some intermittent dizziness and feeling off balance.  He denies any falls or LOC.  Patient states that he has had this arm pain and numbness for the last 2-1/2 months.  He did see a chiropractor, who told him he had a pinched nerve but he states he cannot keep going to the chiropractor because it is too expensive.  Review of Systems  Positive: Numbness, arm pain Negative: Fevers, chills  Physical Exam  BP 135/77   Pulse 97   Temp 98.5 F (36.9 C)   Resp 18   SpO2 92%  Gen:   Awake, no distress   Resp:  Normal effort  MSK:   Moves extremities without difficulty.  Strength is 5 out of 5 on upper and lower extremities.  Sensation is 5 out of 5 in upper and lower extremities.  No neurologic deficits are noted on physical exam.  Patient did have a positive Tinel test.  No C-spine or other back tenderness. Other:    Medical Decision Making  Medically screening exam initiated at 11:36 AM.  Appropriate orders placed.  Andrew Shelton was informed that the remainder of the evaluation will be completed by another provider, this initial triage assessment does not replace that evaluation, and the importance of remaining in the ED until their evaluation is complete.  Given duration of symptoms and lack of red flag symptoms, no imaging is indicated at this time.   Andrew Marry RAMAN, PA-C 09/21/24 1142

## 2024-09-21 NOTE — ED Triage Notes (Addendum)
 Pt c/o left hand and arm numbnessx45mos. Pt c/o left lower back pain that radiates to left hip, numbness and tingling alsox72mos. Pt denies injury. Pt denies loss of bowel or bladder
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
